# Patient Record
Sex: Female | Born: 1960 | Race: Black or African American | Hispanic: No | Marital: Single | Smoking: Never smoker
Health system: Southern US, Community
[De-identification: ages and names within clinical notes are randomized; demographics above are authoritative.]

## PROBLEM LIST (undated history)

## (undated) DIAGNOSIS — I10 Essential (primary) hypertension: Secondary | ICD-10-CM

## (undated) HISTORY — PX: BREAST SURGERY: SHX581

---

## 2014-05-02 ENCOUNTER — Encounter (HOSPITAL_BASED_OUTPATIENT_CLINIC_OR_DEPARTMENT_OTHER): Payer: Self-pay | Admitting: Emergency Medicine

## 2014-05-02 ENCOUNTER — Emergency Department (HOSPITAL_BASED_OUTPATIENT_CLINIC_OR_DEPARTMENT_OTHER)
Admission: EM | Admit: 2014-05-02 | Discharge: 2014-05-02 | Disposition: A | Discharge status: Home / Self Care | Payer: BC Managed Care – PPO | Attending: Emergency Medicine | Admitting: Emergency Medicine

## 2014-05-02 DIAGNOSIS — M62838 Other muscle spasm: Secondary | ICD-10-CM | POA: Insufficient documentation

## 2014-05-02 DIAGNOSIS — Z79899 Other long term (current) drug therapy: Secondary | ICD-10-CM | POA: Insufficient documentation

## 2014-05-02 DIAGNOSIS — Z791 Long term (current) use of non-steroidal anti-inflammatories (NSAID): Secondary | ICD-10-CM | POA: Insufficient documentation

## 2014-05-02 DIAGNOSIS — I10 Essential (primary) hypertension: Secondary | ICD-10-CM | POA: Insufficient documentation

## 2014-05-02 HISTORY — DX: Essential (primary) hypertension: I10

## 2014-05-02 MED ORDER — HYDROCODONE-ACETAMINOPHEN 5-325 MG PO TABS: 1.0000 | ORAL_TABLET | Freq: Four times a day (QID) | ORAL | Status: AC | PRN

## 2014-05-02 MED ORDER — IBUPROFEN 800 MG PO TABS
800.0000 mg | ORAL_TABLET | Freq: Once | ORAL | Status: AC
  Administered 2014-05-02: 800 mg via ORAL
  Filled 2014-05-02: qty 1

## 2014-05-02 MED ORDER — DIAZEPAM 5 MG PO TABS: 5.0000 mg | ORAL_TABLET | Freq: Three times a day (TID) | ORAL | Status: AC | PRN

## 2014-05-02 MED ORDER — IBUPROFEN 800 MG PO TABS: 800.0000 mg | ORAL_TABLET | Freq: Three times a day (TID) | ORAL | Status: AC

## 2014-05-02 NOTE — ED Notes (Addendum)
C/o pain to left arm x 3 weeks-states "feeld like muscle tightening up" and points to left scapular area-denies injury-states urgent care gave her rx for muscle relaxer last week-no relief

## 2014-05-02 NOTE — ED Provider Notes (Signed)
CSN: [id number]     Arrival date & time 05/02/14  1403 History   First MD Initiated Contact with Patient 05/02/14 1416     Chief Complaint  Patient presents with  . Arm Pain     (Consider location/radiation/quality/duration/timing/severity/associated sxs/prior Treatment) HPI Comments: No injury noted that initiated shoulder pain.  Patient is a 53 y.o. female presenting with arm pain. The history is provided by the patient.  Arm Pain This is a new problem. The current episode started more than 1 week ago (3 weeks ago). The problem occurs constantly. The problem has not changed since onset.Pertinent negatives include no chest pain, no abdominal pain, no headaches and no shortness of breath. Exacerbated by: lying still at night, using arm. Nothing relieves the symptoms. Treatments tried: Flexeril. The treatment provided no relief.    Past Medical History  Diagnosis Date  . Hypertension    Past Surgical History  Procedure Laterality Date  . Breast surgery     No family history on file. History  Substance Use Topics  . Smoking status: Never Smoker   . Smokeless tobacco: Not on file  . Alcohol Use: No   OB History   Grav Para Term Preterm Abortions TAB SAB Ect Mult Living                 Review of Systems  Constitutional: Negative for fever and chills.  Respiratory: Negative for cough and shortness of breath.   Cardiovascular: Negative for chest pain.  Gastrointestinal: Negative for vomiting and abdominal pain.  Neurological: Negative for headaches.  All other systems reviewed and are negative.     Allergies  Review of patient's allergies indicates no known allergies.  Home Medications   Prior to Admission medications   Medication Sig Start Date End Date Taking? Authorizing Provider  ATENOLOL-CHLORTHALIDONE PO Take by mouth.   Yes Historical Provider, MD  Cyclobenzaprine HCl (FLEXERIL PO) Take by mouth.   Yes Historical Provider, MD  Estrogens Conjugated (PREMARIN  PO) Take by mouth.   Yes Historical Provider, MD  MetroNIDAZOLE (FLAGYL PO) Take by mouth.   Yes Historical Provider, MD  nitrofurantoin, macrocrystal-monohydrate, (MACROBID) 100 MG capsule Take 100 mg by mouth 2 (two) times daily.   Yes Historical Provider, MD  diazepam (VALIUM) 5 MG tablet Take 1 tablet (5 mg total) by mouth every 8 (eight) hours as needed (spasms). 05/02/14   Dagmar HaitWilliam Blair Walden, MD  HYDROcodone-acetaminophen (NORCO/VICODIN) 5-325 MG per tablet Take 1 tablet by mouth every 6 (six) hours as needed for moderate pain. 05/02/14   Dagmar HaitWilliam Blair Walden, MD  ibuprofen (ADVIL,MOTRIN) 800 MG tablet Take 1 tablet (800 mg total) by mouth 3 (three) times daily. 05/02/14   Dagmar HaitWilliam Blair Walden, MD   BP 154/90  Pulse 90  Temp(Src) 98.1 F (36.7 C) (Oral)  Resp 16  Ht 5\' 2"  (1.575 m)  Wt 200 lb (90.719 kg)  BMI 36.57 kg/m2  SpO2 100% Physical Exam  Nursing note and vitals reviewed. Constitutional: She is oriented to person, place, and time. She appears well-developed and well-nourished. No distress.  HENT:  Head: Normocephalic and atraumatic.  Mouth/Throat: Oropharynx is clear and moist.  Eyes: EOM are normal. Pupils are equal, round, and reactive to light.  Neck: Normal range of motion. Neck supple.  Cardiovascular: Normal rate and regular rhythm.  Exam reveals no friction rub.   No murmur heard. Pulmonary/Chest: Effort normal and breath sounds normal. No respiratory distress. She has no wheezes. She has no rales.  Abdominal: Soft. She exhibits no distension. There is no tenderness. There is no rebound.  Musculoskeletal: Normal range of motion. She exhibits no edema.       Left shoulder: She exhibits tenderness (upper trapezius) and spasm (upper scapula). She exhibits normal range of motion.  Neurological: She is alert and oriented to person, place, and time.  Skin: She is not diaphoretic.    ED Course  Procedures (including critical care time) Labs Review Labs Reviewed - No  data to display  Imaging Review No results found.   EKG Interpretation None      MDM   Final diagnoses:  Muscle spasm    89F here with left shoulder pain. Present for 3 weeks, no relief with flexeril. No injury. PCP evaluated for muscle spasm.  L shoulder, scapula with muscular tenderness in upper scapula region. Normal strength and sensation, but states it will occasionally radiates down into her fingers. NVI in entire L arm.  Given valium, motrin, vicodin for breakthrough pain. Instructed to go PT or massage therapy to help. Also instructed to f/u with PCP. No SOB or CP, no concern for angina. Stable for discharge.    Dagmar HaitWilliam Blair Walden, MD 05/02/14 1434

## 2014-05-02 NOTE — Discharge Instructions (Signed)
°  Muscle Cramps and Spasms °Muscle cramps and spasms occur when a muscle or muscles tighten and you have no control over this tightening (involuntary muscle contraction). They are a common problem and can develop in any muscle. The most common place is in the calf muscles of the leg. Both muscle cramps and muscle spasms are involuntary muscle contractions, but they also have differences:  °· Muscle cramps are sporadic and painful. They may last a few seconds to a quarter of an hour. Muscle cramps are often more forceful and last longer than muscle spasms. °· Muscle spasms may or may not be painful. They may also last just a few seconds or much longer. °CAUSES  °It is uncommon for cramps or spasms to be due to a serious underlying problem. In many cases, the cause of cramps or spasms is unknown. Some common causes are:  °· Overexertion.   °· Overuse from repetitive motions (doing the same thing over and over).   °· Remaining in a certain position for a long period of time.   °· Improper preparation, form, or technique while performing a sport or activity.   °· Dehydration.   °· Injury.   °· Side effects of some medicines.   °· Abnormally low levels of the salts and ions in your blood (electrolytes), especially potassium and calcium. This could happen if you are taking water pills (diuretics) or you are pregnant.   °Some underlying medical problems can make it more likely to develop cramps or spasms. These include, but are not limited to:  °· Diabetes.   °· Parkinson disease.   °· Hormone disorders, such as thyroid problems.   °· Alcohol abuse.   °· Diseases specific to muscles, joints, and bones.   °· Blood vessel disease where not enough blood is getting to the muscles.   °HOME CARE INSTRUCTIONS  °· Stay well hydrated. Drink enough water and fluids to keep your urine clear or pale yellow. °· It may be helpful to massage, stretch, and relax the affected muscle. °· For tight or tense muscles, use a warm towel, heating  pad, or hot shower water directed to the affected area. °· If you are sore or have pain after a cramp or spasm, applying ice to the affected area may relieve discomfort. °· Put ice in a plastic bag. °· Place a towel between your skin and the bag. °· Leave the ice on for 15-20 minutes, 03-04 times a day. °· Medicines used to treat a known cause of cramps or spasms may help reduce their frequency or severity. Only take over-the-counter or prescription medicines as directed by your caregiver. °SEEK MEDICAL CARE IF:  °Your cramps or spasms get more severe, more frequent, or do not improve over time.  °MAKE SURE YOU:  °· Understand these instructions. °· Will watch your condition. °· Will get help right away if you are not doing well or get worse. °Document Released: 04/25/2002 Document Revised: 02/28/2013 Document Reviewed: 10/20/2012 °Exit[employer]® Patient Information ©2014 [employer]. ° ° °

## 2014-05-02 NOTE — ED Notes (Signed)
MD at bedside. 

## 2014-05-04 ENCOUNTER — Encounter (HOSPITAL_BASED_OUTPATIENT_CLINIC_OR_DEPARTMENT_OTHER): Payer: Self-pay | Admitting: Emergency Medicine

## 2014-05-04 ENCOUNTER — Emergency Department (HOSPITAL_BASED_OUTPATIENT_CLINIC_OR_DEPARTMENT_OTHER)
Admission: EM | Admit: 2014-05-04 | Discharge: 2014-05-04 | Disposition: A | Discharge status: Home / Self Care | Payer: BC Managed Care – PPO | Attending: Emergency Medicine | Admitting: Emergency Medicine

## 2014-05-04 ENCOUNTER — Emergency Department (HOSPITAL_BASED_OUTPATIENT_CLINIC_OR_DEPARTMENT_OTHER): Payer: BC Managed Care – PPO

## 2014-05-04 DIAGNOSIS — M7532 Calcific tendinitis of left shoulder: Secondary | ICD-10-CM

## 2014-05-04 DIAGNOSIS — Z791 Long term (current) use of non-steroidal anti-inflammatories (NSAID): Secondary | ICD-10-CM | POA: Insufficient documentation

## 2014-05-04 DIAGNOSIS — Z79899 Other long term (current) drug therapy: Secondary | ICD-10-CM | POA: Insufficient documentation

## 2014-05-04 DIAGNOSIS — Z792 Long term (current) use of antibiotics: Secondary | ICD-10-CM | POA: Insufficient documentation

## 2014-05-04 DIAGNOSIS — I10 Essential (primary) hypertension: Secondary | ICD-10-CM | POA: Insufficient documentation

## 2014-05-04 DIAGNOSIS — M7530 Calcific tendinitis of unspecified shoulder: Secondary | ICD-10-CM | POA: Insufficient documentation

## 2014-05-04 MED ORDER — KETOROLAC TROMETHAMINE 60 MG/2ML IM SOLN
60.0000 mg | Freq: Once | INTRAMUSCULAR | Status: AC
  Administered 2014-05-04: 60 mg via INTRAMUSCULAR
  Filled 2014-05-04: qty 2

## 2014-05-04 MED ORDER — MELOXICAM 15 MG PO TABS: 15.0000 mg | ORAL_TABLET | Freq: Every day | ORAL | Status: DC

## 2014-05-04 NOTE — ED Notes (Signed)
Patient transported to X-ray 

## 2014-05-04 NOTE — Discharge Instructions (Signed)
Calcific Tendinitis °Calcific tendinitis occurs when crystals of calcium are deposited in a tendon. Tendons are bands of strong, fibrous tissue that attach muscles to bones. Tendons are an important part of joints. They make the joint move and they absorb some of the stress that a joint receives during use. When calcium is deposited in the tendon, the tendon becomes stiff, painful, and it can become swollen. Calcific tendinitis occurs frequently in the shoulder joint, in a structure called the rotator cuff. °CAUSES  °The cause of calcific tendinitis is unclear. It may be associated with: °· Overuse of the tendon, such as from repetitive motion. °· Excess stress on the tendon. °· Aging. °· Repetitive, mild injuries. °SYMPTOMS  °· Pain may or may not be present. If it is present, it may occur when moving the joint. °· Tenderness when pressure is applied to the tendon. °· A snapping or popping sound when the joint moves. °· Decreased motion of the joint. °· Difficulty sleeping due to pain in the joint. °DIAGNOSIS  °Your caregiver will perform a physical exam. Imaging tests may also be used to make the diagnosis. These may include X-rays, an MRI, or a CT scan. °TREATMENT  °Generally, calcific tendinitis resolves on its own. Treatment for pain of calcific tendinitis may include: °· Taking over-the-counter medicines, such as anti-inflammatory drugs. °· Applying ice packs to the joint. °· Following a specific exercise program to keep the joint working properly. °· Attending physical therapy sessions. °· Avoiding activities that cause pain. °Treatment for more severe calcific tendinitis may require: °· Injecting cortisone steroids or pain relieving medicines into or around the joint. °· Manipulating the joint after you are given medicine to numb the area (local anesthetic). °· Inflating the joint with sterile fluid to increase the flexibility of the tendons. °· Shockwave therapy, which involves focusing sounds waves on the  joint. °If other treatments do not work, surgery may be done to clean out the calcium deposits and repair the tendons where needed. Most people do not need surgery. °HOME CARE INSTRUCTIONS  °· Only take over-the-counter or prescription medicines for pain, fever, or discomfort as directed by your caregiver. °· Follow your caregiver's recommendations for activity and exercise. °SEEK MEDICAL CARE IF: °· You notice an increase in pain or numbness. °· You develop new weakness. °· You notice increased joint stiffness or a sensation of looseness in the joint. °· You notice increasing redness, swelling, or warmth around the joint area. °SEEK IMMEDIATE MEDICAL CARE IF: °· You have a fever or persistent symptoms for more than 2 to 3 days. °· You have a fever and your symptoms suddenly get worse. °MAKE SURE YOU: °· Understand these instructions. °· Will watch your condition. °· Will get help right away if you are not doing well or get worse. °Document Released: 08/12/2008 Document Revised: 05/04/2012 Document Reviewed: 02/12/2012 °ExitCare® Patient Information ©2015 [employer]. This information is not intended to replace advice given to you by your health care provider. Make sure you discuss any questions you have with your health care provider. ° °

## 2014-05-04 NOTE — ED Provider Notes (Signed)
CSN: [id number]     Arrival date & time 05/04/14  0232 History   First MD Initiated Contact with Patient 05/04/14 0259     Chief Complaint  Patient presents with  . Extremity Pain     (Consider location/radiation/quality/duration/timing/severity/associated sxs/prior Treatment) Patient is a 53 y.o. female presenting with extremity pain. The history is provided by the patient.  Extremity Pain This is a new problem. The current episode started more than 1 week ago. The problem occurs constantly. The problem has not changed since onset.Pertinent negatives include no chest pain, no abdominal pain, no headaches and no shortness of breath. Exacerbated by: movement of the LUE. Nothing relieves the symptoms. Treatments tried: norco valium and ibuprofen. The treatment provided no relief.  States movement of the left shoulder makes it worse.  Pain meds not working.  She has no DOE no SOB no n/v/d. No weakness or numbness.  No f/c/r.  No rashes on the skin  Past Medical History  Diagnosis Date  . Hypertension    Past Surgical History  Procedure Laterality Date  . Breast surgery     History reviewed. No pertinent family history. History  Substance Use Topics  . Smoking status: Never Smoker   . Smokeless tobacco: Not on file  . Alcohol Use: No   OB History   Grav Para Term Preterm Abortions TAB SAB Ect Mult Living                 Review of Systems  Constitutional: Negative for fever.  Respiratory: Negative for chest tightness, shortness of breath and wheezing.   Cardiovascular: Negative for chest pain, palpitations and leg swelling.  Gastrointestinal: Negative for nausea, vomiting and abdominal pain.  Musculoskeletal: Negative for back pain, gait problem, neck pain and neck stiffness.  Skin: Negative for rash.  Neurological: Negative for weakness, numbness and headaches.  All other systems reviewed and are negative.     Allergies  Review of patient's allergies indicates no known  allergies.  Home Medications   Prior to Admission medications   Medication Sig Start Date End Date Taking? Authorizing Provider  ATENOLOL-CHLORTHALIDONE PO Take by mouth.   Yes Historical Provider, MD  Cyclobenzaprine HCl (FLEXERIL PO) Take by mouth.   Yes Historical Provider, MD  diazepam (VALIUM) 5 MG tablet Take 1 tablet (5 mg total) by mouth every 8 (eight) hours as needed (spasms). 05/02/14  Yes Dagmar HaitWilliam Blair Walden, MD  Estrogens Conjugated (PREMARIN PO) Take by mouth.   Yes Historical Provider, MD  HYDROcodone-acetaminophen (NORCO/VICODIN) 5-325 MG per tablet Take 1 tablet by mouth every 6 (six) hours as needed for moderate pain. 05/02/14  Yes Dagmar HaitWilliam Blair Walden, MD  ibuprofen (ADVIL,MOTRIN) 800 MG tablet Take 1 tablet (800 mg total) by mouth 3 (three) times daily. 05/02/14  Yes Dagmar HaitWilliam Blair Walden, MD  MetroNIDAZOLE (FLAGYL PO) Take by mouth.   Yes Historical Provider, MD  nitrofurantoin, macrocrystal-monohydrate, (MACROBID) 100 MG capsule Take 100 mg by mouth 2 (two) times daily.   Yes Historical Provider, MD   BP 150/91  Pulse 82  Temp(Src) 98 F (36.7 C) (Oral)  Resp 20  Ht 5\' 2"  (1.575 m)  Wt 200 lb (90.719 kg)  BMI 36.57 kg/m2  SpO2 98% Physical Exam  Constitutional: She is oriented to person, place, and time. She appears well-developed and well-nourished. No distress.  HENT:  Head: Normocephalic and atraumatic.  Mouth/Throat: Oropharynx is clear and moist.  Eyes: Conjunctivae are normal. Pupils are equal, round, and reactive to light.  Neck: Normal range of motion. Neck supple.  Cardiovascular: Normal rate, regular rhythm and intact distal pulses.   Pulmonary/Chest: Effort normal and breath sounds normal. She has no wheezes. She has no rales.  Abdominal: Soft. Bowel sounds are normal. There is no tenderness. There is no rebound and no guarding.  Musculoskeletal: Normal range of motion.  Pain with palpation of the trapezius with spasm,  Passively FROM of the LUE.   Intact biceps and triceps tendon.  Patient can move.  Radial pulse 2+, intact pronation and supination of the left arm, no winging of the scapula no point tenderness no crepitance of the c or t spine.    Neurological: She is alert and oriented to person, place, and time. She has normal reflexes. She exhibits normal muscle tone. Coordination normal.  Skin: Skin is warm and dry. No rash noted.  Psychiatric: She has a normal mood and affect.    ED Course  Procedures (including critical care time) Labs Review Labs Reviewed - No data to display  Imaging Review No results found.   EKG Interpretation None      MDM   Final diagnoses:  None   This is clearly muscular it is not cardiac nor is there a neurologic basis.  No fevers, no spinal pain. Highly doubt epidural abscess or bony infection.     Patient is instructed that holding the arm in that position and not moving it can lead to a frozen shoulder and she is instructed to do gentle range of motion.  Will change ibuprofen to meloxicam and add robaxin.  Will give referral to sports medicine.  She is instructed to call for an appointment.     Jasmine AweApril K Palumbo-Rasch, MD 05/04/14 [phone]

## 2014-05-04 NOTE — ED Notes (Signed)
Left arm pain all week, was seen here Tuesday for same but no relief from meds. Pt denies CP or SOB.

## 2020-07-17 ENCOUNTER — Encounter: Payer: Self-pay | Admitting: Family Medicine

## 2020-07-17 ENCOUNTER — Ambulatory Visit (INDEPENDENT_AMBULATORY_CARE_PROVIDER_SITE_OTHER): Payer: BC Managed Care – PPO | Admitting: Family Medicine

## 2020-07-17 ENCOUNTER — Other Ambulatory Visit: Payer: Self-pay

## 2020-07-17 ENCOUNTER — Ambulatory Visit (HOSPITAL_BASED_OUTPATIENT_CLINIC_OR_DEPARTMENT_OTHER)
Admission: RE | Admit: 2020-07-17 | Discharge: 2020-07-17 | Disposition: A | Discharge status: Home / Self Care | Payer: BC Managed Care – PPO | Source: Ambulatory Visit | Attending: Family Medicine | Admitting: Family Medicine

## 2020-07-17 VITALS — BP 172/93 | HR 84 | Ht 62.0 in | Wt 203.0 lb

## 2020-07-17 DIAGNOSIS — M533 Sacrococcygeal disorders, not elsewhere classified: Secondary | ICD-10-CM

## 2020-07-17 DIAGNOSIS — M545 Low back pain, unspecified: Secondary | ICD-10-CM

## 2020-07-17 DIAGNOSIS — M25561 Pain in right knee: Secondary | ICD-10-CM | POA: Diagnosis not present

## 2020-07-17 DIAGNOSIS — M542 Cervicalgia: Secondary | ICD-10-CM | POA: Diagnosis not present
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
## 2020-07-17 NOTE — Progress Notes (Signed)
Medication Samples have been provided to the patient.  Drug name: Duexis       Strength: 800mg /26.6mg         Qty: 2 boxes  LOT:  Exp.Date: 02/2021 Dosing instructions: take 1 tablet by mouth three (3) times a day.  The patient has been instructed regarding the correct time, dose, and frequency of taking this medication, including desired effects and most common side effects.   03/2021, MA 11:55 AM 07/17/2020

## 2020-07-17 NOTE — Patient Instructions (Signed)
Nice to meet you  Please try heat  Please try the duexis for 3 days straight and then as needed  Please try the exercises  Let me know about physical therapy  I will call with the results from today  Please send me a message in MyChart with any questions or updates.  Please see me back in 4 weeks.   --Dr. Jordan Likes
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
## 2020-07-17 NOTE — Assessment & Plan Note (Signed)
Has pain with sitting down.  Has to shift from side to side.  Possible to have joint irritation at the base. -X-ray. -Could consider cushioning.

## 2020-07-18 ENCOUNTER — Telehealth: Payer: Self-pay | Admitting: Family Medicine

## 2020-07-18 NOTE — Telephone Encounter (Signed)
Unable to leave VM for patient. If she calls back please have her speak with a nurse/CMA and inform that her xrays are negative for fracture.   If any questions then please take the best time and phone number to call and I will try to call her back.   Myra Rude, MD [facility] 07/18/2020, 2:48 PM

## 2020-08-16 ENCOUNTER — Other Ambulatory Visit: Payer: Self-pay

## 2020-08-16 ENCOUNTER — Ambulatory Visit (HOSPITAL_BASED_OUTPATIENT_CLINIC_OR_DEPARTMENT_OTHER)
Admission: RE | Admit: 2020-08-16 | Discharge: 2020-08-16 | Disposition: A | Discharge status: Home / Self Care | Payer: BC Managed Care – PPO | Source: Ambulatory Visit | Attending: Family Medicine | Admitting: Family Medicine

## 2020-08-16 ENCOUNTER — Encounter: Payer: Self-pay | Admitting: Family Medicine

## 2020-08-16 ENCOUNTER — Ambulatory Visit: Payer: Self-pay

## 2020-08-16 ENCOUNTER — Ambulatory Visit (INDEPENDENT_AMBULATORY_CARE_PROVIDER_SITE_OTHER): Payer: BC Managed Care – PPO | Admitting: Family Medicine

## 2020-08-16 VITALS — BP 121/80 | HR 65 | Ht 62.0 in | Wt 203.0 lb

## 2020-08-16 DIAGNOSIS — M25561 Pain in right knee: Secondary | ICD-10-CM

## 2020-08-16 DIAGNOSIS — M542 Cervicalgia: Secondary | ICD-10-CM

## 2020-08-16 DIAGNOSIS — M545 Low back pain, unspecified: Secondary | ICD-10-CM

## 2020-08-16 MED ORDER — PENNSAID 2 % EX SOLN: 1.0000 "application " | Freq: Two times a day (BID) | CUTANEOUS | 2 refills | Status: AC

## 2020-08-16 NOTE — Assessment & Plan Note (Signed)
Pain is improving. -Counseled on home exercise therapy and supportive care. -Could consider physical therapy.

## 2020-08-16 NOTE — Assessment & Plan Note (Signed)
Symptoms have been ongoing.  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 -Counseled on home exercise therapy and supportive care. -Could consider physical therapy.
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
## 2020-08-16 NOTE — Patient Instructions (Signed)
Good to see you Please try the exercises  Please try the rub on medicine  I will call with the results from today   Please send me a message in MyChart with any questions or updates.  Please see me back in 4-6 weeks.   --Dr. Jordan Likes

## 2020-08-16 NOTE — Progress Notes (Signed)
Medication Samples have been provided to the patient.  Drug name: Pennsaid       Strength: 2%        Qty: 1 Box  LOT: [id number]  Exp.Date: 01/2021  Dosing instructions: use a pea size amount and rub gently.  The patient has been instructed regarding the correct time, dose, and frequency of taking this medication, including desired effects and most common side effects.   Kathi Simpers, [place] 3:48 PM 08/16/2020
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
## 2020-08-20 ENCOUNTER — Telehealth: Payer: Self-pay | Admitting: Family Medicine

## 2020-08-20 NOTE — Telephone Encounter (Signed)
Unable to leave VM for patient. If she calls back please have her speak with a nurse/CMA and inform that her xrays show some degenerative changes behind the knee cap.   If any questions then please take the best time and phone number to call and I will try to call her back.   Myra Rude, MD [facility] 08/20/2020, 9:23 AM

## 2020-09-20 ENCOUNTER — Ambulatory Visit: Payer: BC Managed Care – PPO | Admitting: Family Medicine
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
## 2021-07-27 IMAGING — DX DG KNEE COMPLETE 4+V*R*
4 series · 4 of 4 positions shown · non-contrast
Comparison: None.

CLINICAL DATA: Right knee pain

EXAM:
RIGHT KNEE - COMPLETE 4+ VIEW

[knee ap]
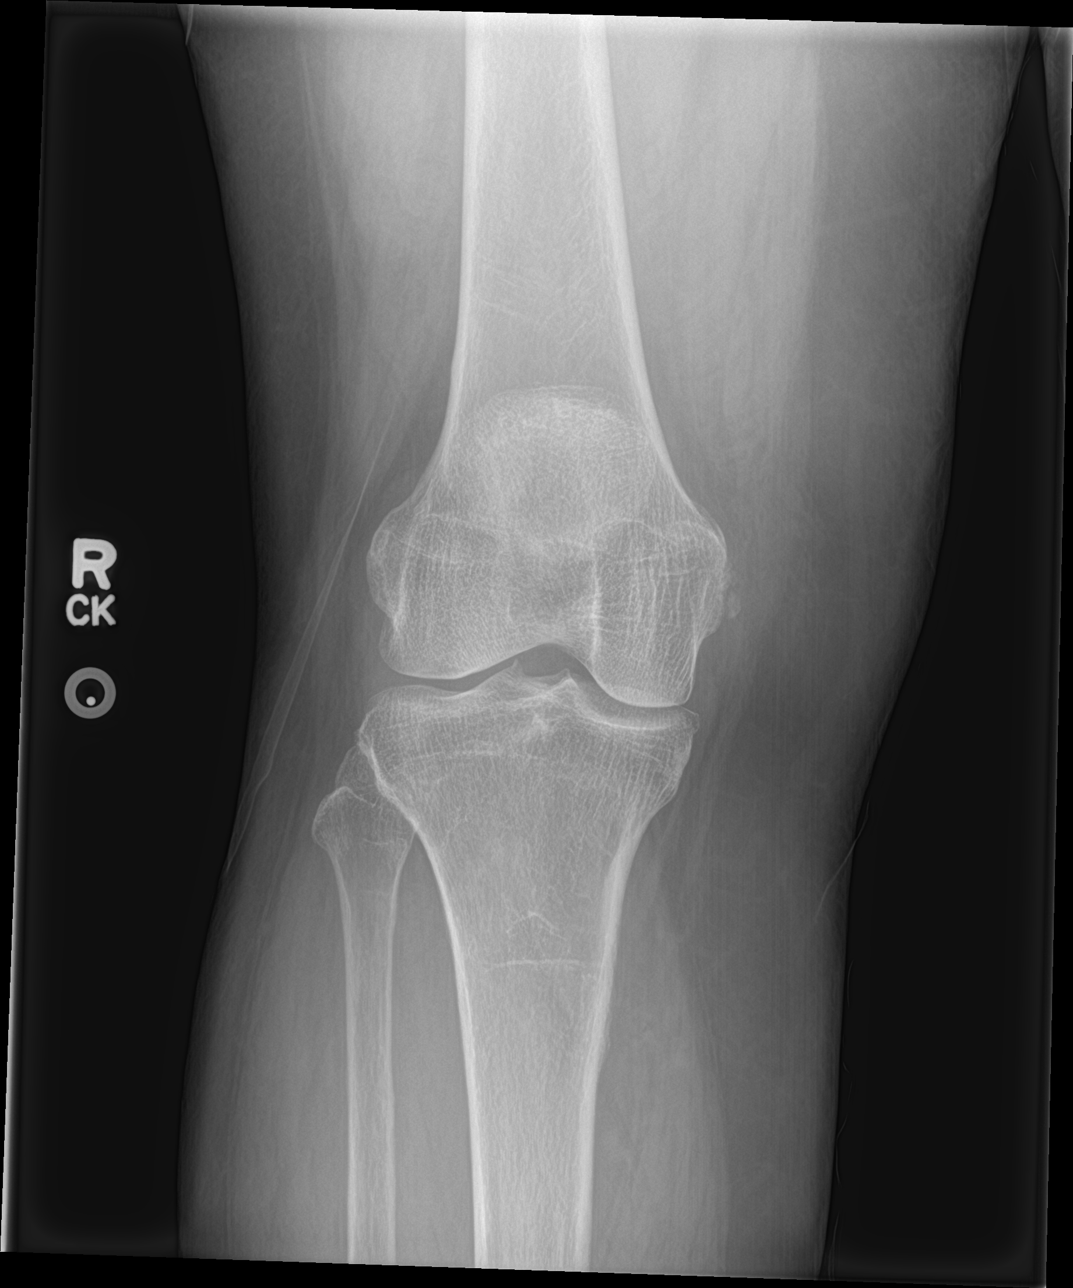

[tunnel]
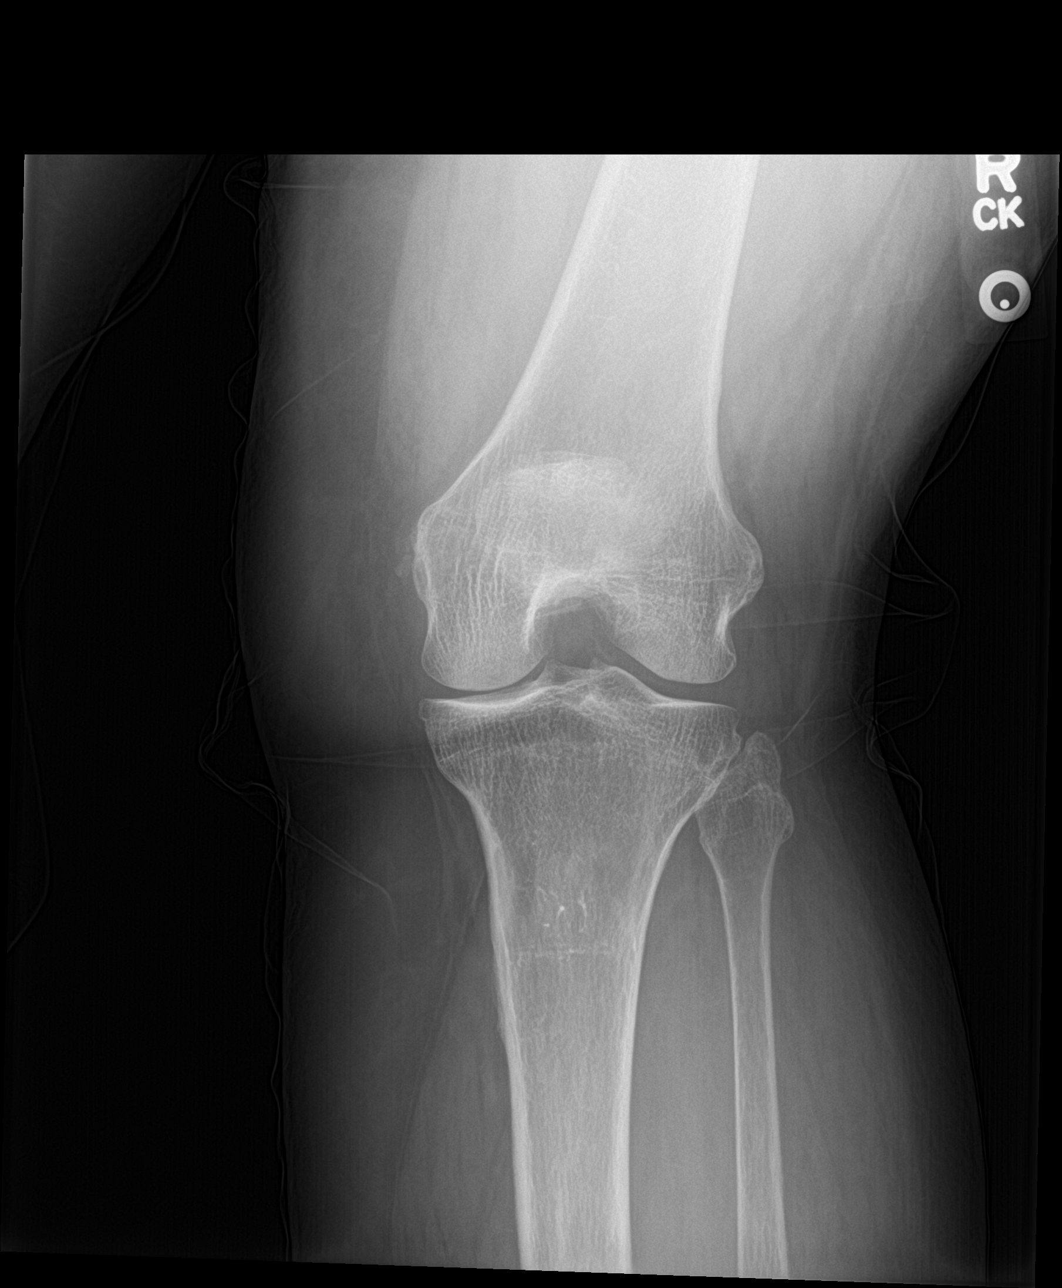

[knee lat]
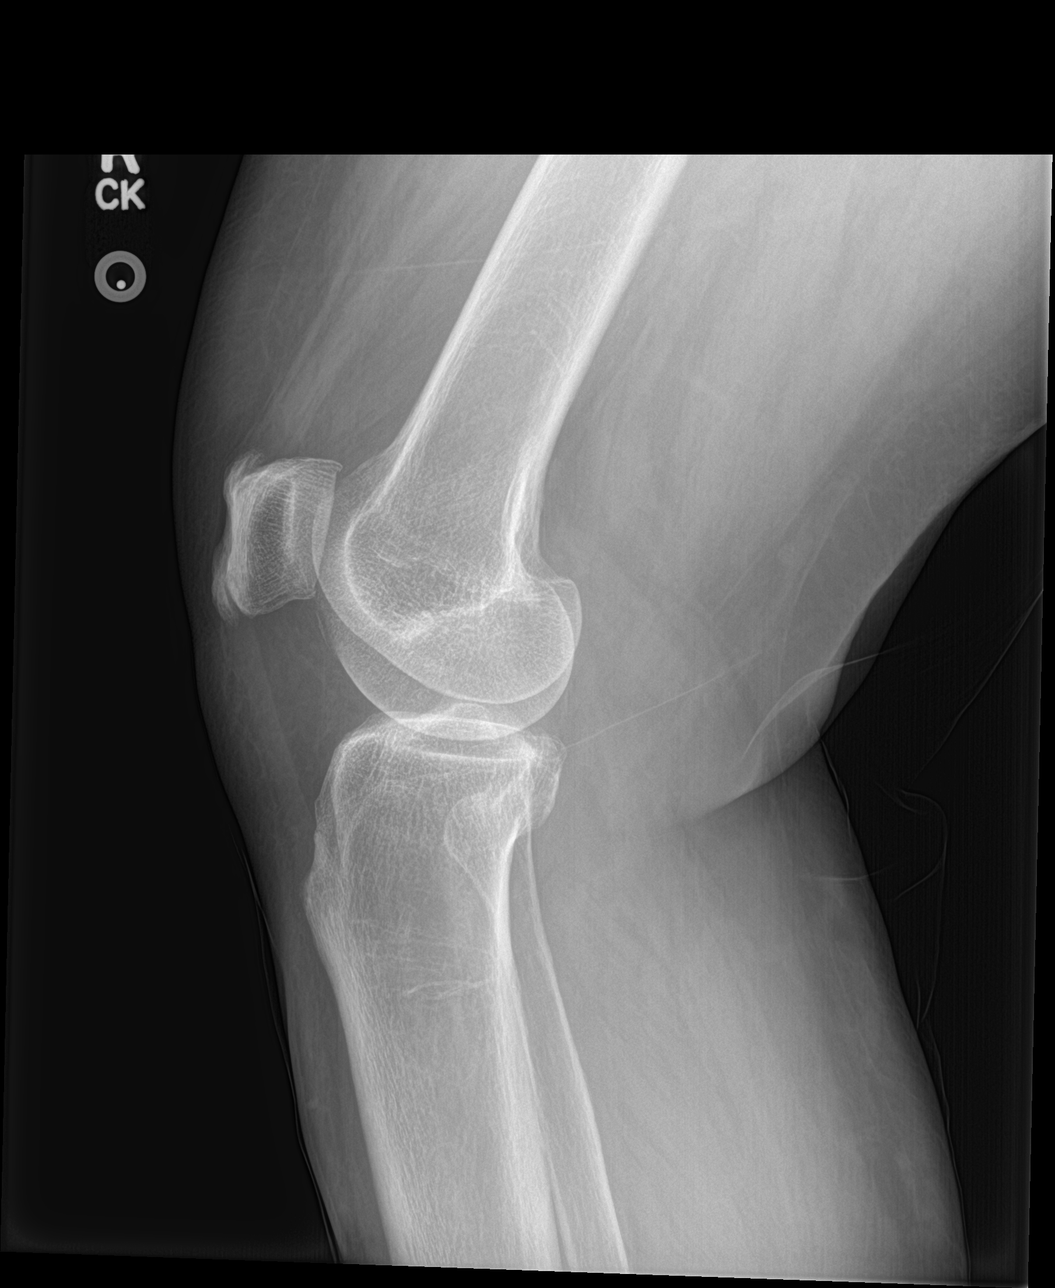

[knee sunrise]
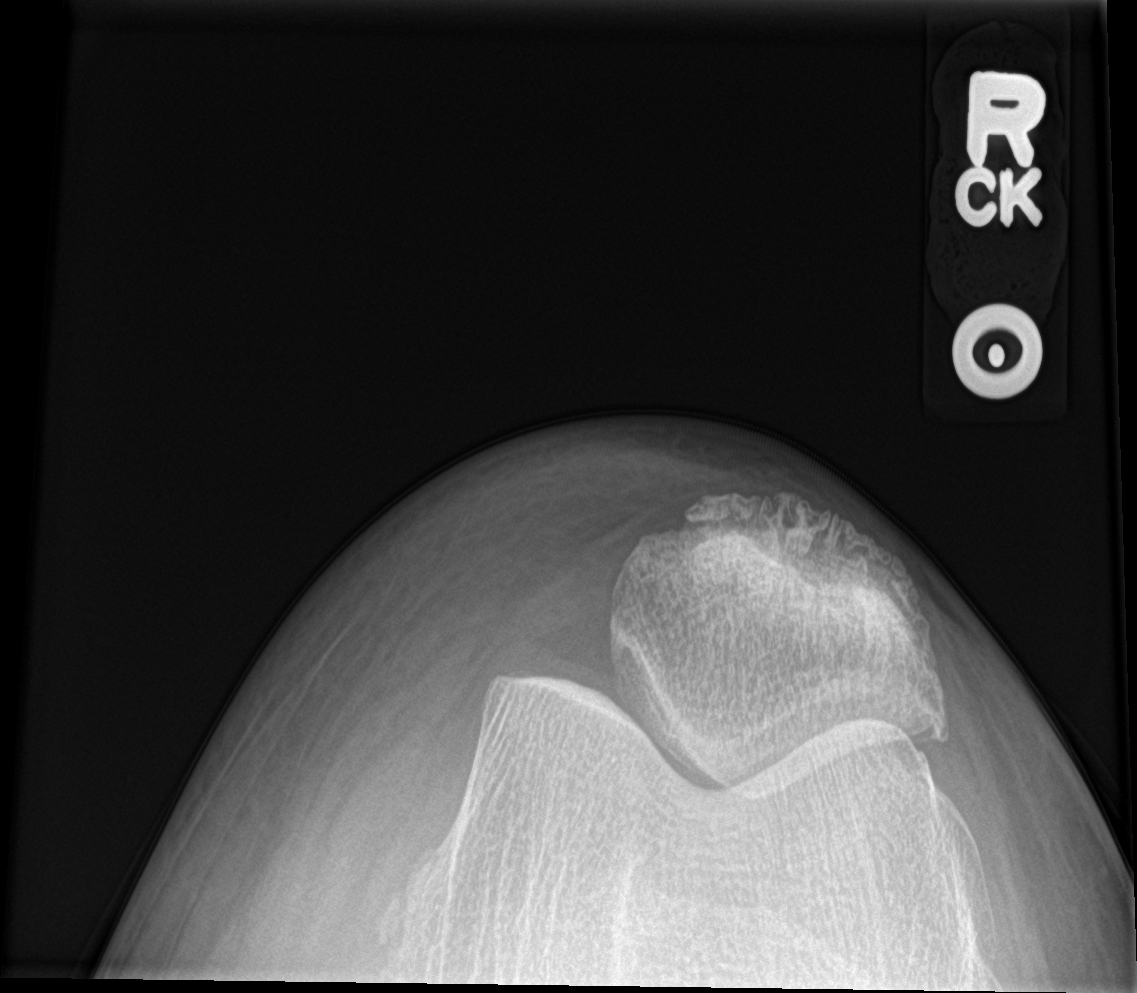

[4 of 4 positions shown; findings below may reference images not displayed]

FINDINGS: No acute displaced fracture or malalignment. Faint calcifications
adjacent to the medial femoral condyle, suspect for ligamentous
injury. Mild degenerative changes involving the medial joint space.
No large knee effusion.
IMPRESSION: 1. No acute osseous abnormality.
2. Faint calcifications adjacent to the medial femoral condyle,
suspect for avulsion and or ligamentous injury.

## 2023-03-04 ENCOUNTER — Encounter: Payer: Self-pay | Admitting: *Deleted
